# Patient Record
Sex: Male | Born: 1995 | Race: White | Hispanic: No | Marital: Single | Smoking: Never smoker
Health system: Southern US, Community
[De-identification: ages and names within clinical notes are randomized; demographics above are authoritative.]

---
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
## 2017-10-04 ENCOUNTER — Encounter: Payer: Self-pay | Admitting: Pulmonary Disease

## 2017-10-04 ENCOUNTER — Ambulatory Visit (INDEPENDENT_AMBULATORY_CARE_PROVIDER_SITE_OTHER): Payer: Managed Care, Other (non HMO) | Admitting: Pulmonary Disease

## 2017-10-04 VITALS — BP 124/82 | HR 69 | Ht 74.0 in | Wt 228.0 lb

## 2017-10-04 DIAGNOSIS — J45909 Unspecified asthma, uncomplicated: Secondary | ICD-10-CM

## 2017-10-04 MED ORDER — ALBUTEROL SULFATE HFA 108 (90 BASE) MCG/ACT IN AERS: 2.0000 | INHALATION_SPRAY | RESPIRATORY_TRACT | 5 refills | Status: DC | PRN

## 2017-10-04 MED ORDER — FLUTICASONE FUROATE-VILANTEROL 100-25 MCG/INH IN AEPB: 1.0000 | INHALATION_SPRAY | Freq: Every day | RESPIRATORY_TRACT | 6 refills | Status: DC

## 2017-10-04 NOTE — Progress Notes (Signed)
PULMONARY CONSULT NOTE  Requesting MD/Service: Self Date of initial consultation: 10/04/17 Reason for consultation: Asthma  PT PROFILE: 22 y.o. male never smoker who was diagnosed with asthma/allergies as a child.  He has been maintained on Breo inhaler with good effect.  He has no local physician to prescribe this medication.  DATA: Spirometry 10/04/17: FVC 110% predicted, FEV1 96% predicted, FEV1/FVC 72%.  Flow volume curve consistent with possible mild obstruction  INTERVAL:  HPI:  As above.  His asthma has been well controlled on Breo.  He is presently training for a half marathon which he will run next week.  He is concerned because he is almost out of his Breo with no refills available.  He uses albuterol prior to exercise.  Otherwise, he rarely requires albuterol (at most 2 times per week).  He has been prescribed Singulair in the past but never took it since his brother had an adverse reaction to that medication.  He denies CP, fever, purulent sputum, hemoptysis, LE edema and calf tenderness   History reviewed. No pertinent past medical history.  History reviewed. No pertinent surgical history.  MEDICATIONS: I have reviewed all medications and confirmed regimen as documented  Social History   Socioeconomic History  . Marital status: Single    Spouse name: Not on file  . Number of children: Not on file  . Years of education: Not on file  . Highest education level: Not on file  Occupational History  . Not on file  Social Needs  . Financial resource strain: Not on file  . Food insecurity:    Worry: Not on file    Inability: Not on file  . Transportation needs:    Medical: Not on file    Non-medical: Not on file  Tobacco Use  . Smoking status: Never Smoker  . Smokeless tobacco: Never Used  Substance and Sexual Activity  . Alcohol use: No  . Drug use: No  . Sexual activity: Never  Lifestyle  . Physical activity:    Days per week: Not on file    Minutes per  session: Not on file  . Stress: Not on file  Relationships  . Social connections:    Talks on phone: Not on file    Gets together: Not on file    Attends religious service: Not on file    Active member of club or organization: Not on file    Attends meetings of clubs or organizations: Not on file    Relationship status: Not on file  . Intimate partner violence:    Fear of current or ex partner: Not on file    Emotionally abused: Not on file    Physically abused: Not on file    Forced sexual activity: Not on file  Other Topics Concern  . Not on file  Social History Narrative  . Not on file    History reviewed. No pertinent family history.  ROS: No fever, myalgias/arthralgias, unexplained weight loss or weight gain No new focal weakness or sensory deficits No otalgia, hearing loss, visual changes, nasal and sinus symptoms, mouth and throat problems No neck pain or adenopathy No abdominal pain, N/V/D, diarrhea, change in bowel pattern No dysuria, change in urinary pattern   Vitals:   10/04/17 0807  BP: 124/82  Pulse: 69  SpO2: 100%  Weight: 228 lb (103.4 kg)  Height: 6\' 2"  (1.88 m)     EXAM:  Gen: WDWN, No overt respiratory distress HEENT: NCAT, sclera white, oropharynx normal  Neck: Supple without LAN, thyromegaly, JVD Lungs: breath sounds full, percussion normal, no wheezes or other adventitious sounds Cardiovascular: RRR, no murmurs noted Abdomen: Soft, nontender, normal BS Ext: without clubbing, cyanosis, edema Neuro: CNs grossly intact, motor and sensory intact Skin: Limited exam, no lesions noted  DATA:   No flowsheet data found.  No flowsheet data found.  CXR: None available  I have personally reviewed all chest radiographs reported above including CXRs and CT chest unless otherwise indicated  IMPRESSION:     ICD-10-CM   1. Mild persistent asthma J45.909 Spirometry with Graph     PLAN:  Continue Breo 100/25, 1 inhalation daily.  Rinse mouth  after use  Prescription refilled Continue albuterol, 2 inhalations as needed and prior to exercise  Prescription refilled Follow-up in 1 year or sooner as needed   Billy Fischeravid Simonds, MD PCCM service Mobile [phone] Pager [phone] 10/04/2017 3:43 PM

## 2017-10-04 NOTE — Patient Instructions (Signed)
Cont Breo 100/25, one inhalation daily. Rinse mouth after use Continue albuterol 2 inhalations as needed and prior to exercise Follow up in 1 year or sooner as needed

## 2018-05-06 ENCOUNTER — Telehealth: Payer: Self-pay | Admitting: Pulmonary Disease

## 2018-05-06 NOTE — Telephone Encounter (Signed)
Contacted by patient expressing his concern that he has OSA. He is a heavy snorer with reported witnessed apneas and daytime hypersomnolence. We have discussed this in the past and I suggested that he should be evaluated for possible OSA with a PSG. He wishes to have this done now. I have requested a home PSG, We will schedule follow up after results are available to me  Eric Fischer, MD PCCM service Mobile [phone] Pager [phone] 05/06/2018 12:47 PM

## 2018-05-07 ENCOUNTER — Ambulatory Visit (INDEPENDENT_AMBULATORY_CARE_PROVIDER_SITE_OTHER): Payer: Managed Care, Other (non HMO) | Admitting: Pulmonary Disease

## 2018-05-07 ENCOUNTER — Encounter: Payer: Self-pay | Admitting: Pulmonary Disease

## 2018-05-07 VITALS — BP 120/86 | HR 92 | Ht 74.0 in | Wt 234.2 lb

## 2018-05-07 DIAGNOSIS — G4719 Other hypersomnia: Secondary | ICD-10-CM | POA: Diagnosis not present

## 2018-05-07 DIAGNOSIS — R0683 Snoring: Secondary | ICD-10-CM | POA: Diagnosis not present

## 2018-05-07 DIAGNOSIS — J4530 Mild persistent asthma, uncomplicated: Secondary | ICD-10-CM

## 2018-05-07 MED ORDER — FLUTICASONE-SALMETEROL 230-21 MCG/ACT IN AERO: 2.0000 | INHALATION_SPRAY | Freq: Every day | RESPIRATORY_TRACT | 12 refills | Status: DC

## 2018-05-07 NOTE — Progress Notes (Signed)
PULMONARY/SLEEP OFFICE FOLLOW UP NOTE  Requesting MD/Service: Self Date of initial consultation: 10/04/17 Reason for consultation: Asthma  PT PROFILE: 23 y.o. male never smoker who was diagnosed with asthma/allergies as a child.  He has been maintained on Breo inhaler with good effect.  He has no local physician to prescribe this medication.  DATA: Spirometry 10/04/17: FVC 110% predicted, FEV1 96% predicted, FEV1/FVC 72%.  Flow volume curve consistent with possible mild obstruction  INTERVAL: Patient is a friend of my son and contacted me personally to report symptoms of heavy snoring and daytime hypersomnolence  SUBJ:  As above.  He reports heavy snoring and mild to moderate daytime hypersomnolence for several years.  He has had no significant change in his weight over the past year.  With regard to his asthma, it seems to be well controlled and he rarely requires rescue medication.  However, he requests a change from a DPI to an aerosol inhaler.   Vitals:   05/07/18 1017 05/07/18 1018  BP:  120/86  Pulse:  92  SpO2:  98%  Weight: 234 lb 3.2 oz (106.2 kg)   Height: 6\' 2"  (1.88 m)      EXAM:  Gen: NAD HEENT: NCAT, sclera white Neck: No JVD Lungs: breath sounds full, no wheezes or other adventitious sounds Cardiovascular: RRR, no murmurs Abdomen: Soft, nontender, normal BS Ext: without clubbing, cyanosis, edema Neuro: grossly intact Skin: Limited exam, no lesions noted   DATA:   No flowsheet data found.  No flowsheet data found.  CXR: None available  I have personally reviewed all chest radiographs reported above including CXRs and CT chest unless otherwise indicated  IMPRESSION:     ICD-10-CM   1. Mild persistent asthma, well controlled on Breo J45.30   2. Snoring R06.83 Home sleep test  3. Daytime hypersomnolence G47.19 Home sleep test     PLAN:  Change Breo inhaler to Advair 230-21 daily.  Rinse mouth after use Home sleep study ordered We will contact  you after results of the home sleep study are available Follow-up will be arranged as needed   Billy Fischeravid Simonds, MD PCCM service Mobile [phone] Pager [phone] 05/07/2018 1:46 PM

## 2018-05-07 NOTE — Patient Instructions (Addendum)
Change Breo inhaler to Advair 230-21 daily.  Rinse mouth after use Home sleep study ordered We will contact you after results of the home sleep study are available Follow-up will be arranged as needed

## 2018-05-15 ENCOUNTER — Telehealth: Payer: Self-pay | Admitting: Pulmonary Disease

## 2018-05-16 ENCOUNTER — Other Ambulatory Visit: Payer: Self-pay | Admitting: Pulmonary Disease

## 2018-05-16 MED ORDER — FLUTICASONE-SALMETEROL 113-14 MCG/ACT IN AEPB: 2.0000 | INHALATION_SPRAY | Freq: Two times a day (BID) | RESPIRATORY_TRACT | 10 refills | Status: DC

## 2018-05-16 MED ORDER — FLUTICASONE-SALMETEROL 113-14 MCG/ACT IN AEPB: 1.0000 | INHALATION_SPRAY | Freq: Two times a day (BID) | RESPIRATORY_TRACT | 10 refills | Status: DC

## 2018-05-16 NOTE — Telephone Encounter (Signed)
Called and spoke with patient and he wanted to R/S his HST appointment. Pt has R/S to 05/30/2018 for HST pick up between 3:30-4:30. Rhonda J Cobb Nothing else needed at this time. Rhonda J Cobb

## 2018-06-28 DIAGNOSIS — G4733 Obstructive sleep apnea (adult) (pediatric): Secondary | ICD-10-CM

## 2018-07-03 ENCOUNTER — Telehealth: Payer: Self-pay | Admitting: Internal Medicine

## 2018-07-03 DIAGNOSIS — G4719 Other hypersomnia: Secondary | ICD-10-CM

## 2018-07-03 DIAGNOSIS — G4733 Obstructive sleep apnea (adult) (pediatric): Secondary | ICD-10-CM

## 2018-07-03 DIAGNOSIS — R0683 Snoring: Secondary | ICD-10-CM

## 2018-07-03 NOTE — Telephone Encounter (Signed)
Called and spoke to pt, who states that his dentist does do oral appliance.  Pt would like referral placed to [facility] dentist.  Dr. [name] please advise if okay to order oral appliance. Thanks

## 2018-07-03 NOTE — Telephone Encounter (Signed)
Pt returned call. CB [phone]

## 2018-07-03 NOTE — Telephone Encounter (Signed)
I spoke to patient regarding sleep study results. At this time, he would like to proceed with the dental device as opposed to the CPAP. He will contact us with dentist info as to where he wants it to be sent.

## 2018-07-03 NOTE — Telephone Encounter (Addendum)
HST performed on 06/28/18 showed mild OSA with AHI of 6. Dr. Nicholos Johns recommends auto cpap 5-20. Left message to relay results to pt.

## 2018-07-03 NOTE — Telephone Encounter (Signed)
Lm for pt

## 2018-07-03 NOTE — Telephone Encounter (Signed)
Patient states his dentist does not make the dental devices.  Would like to be referred to one of our dentist.  Patient phone number is [phone].

## 2018-07-04 NOTE — Telephone Encounter (Signed)
Pt is returning call. CB is [phone].

## 2018-07-04 NOTE — Telephone Encounter (Signed)
Called and spoke to patient. Let him know of Dr. [name] recommendations, he is wanting to go ahead with dental device. He will also work on weight loss. Referral entered, nothing further needed at this time.

## 2018-07-04 NOTE — Telephone Encounter (Signed)
lmtcb x1 for pt. 

## 2018-07-04 NOTE — Telephone Encounter (Signed)
Please make that referral. Let him know that sleep apnea is very mild. A dental appliance is a reasonable option. Modest weight loss might cure the OSA.  Thanks  Theodoro Grist

## 2019-07-14 ENCOUNTER — Other Ambulatory Visit: Payer: Self-pay | Admitting: Pulmonary Disease

## 2019-07-14 NOTE — Telephone Encounter (Signed)
Lm for pt

## 2019-07-14 NOTE — Telephone Encounter (Signed)
Pt returning call.  [phone].

## 2019-07-14 NOTE — Progress Notes (Signed)
Virtual Visit via Telephone Note  I connected with Eric Gilbert on 07/15/19 at 11:30 AM EDT by telephone and verified that I am speaking with the correct person using two identifiers.  Location: Patient: Home Provider: Office [facility] - [address] [name], [address], [name], [place] [address]   I discussed the limitations, risks, security and privacy concerns of performing an evaluation and management service by telephone and the availability of in person appointments. I also discussed with the patient that there may be a patient responsible charge related to this service. The patient expressed understanding and agreed to proceed.  Patient consented to consult via telephone: Yes People present and their role in pt care: Pt    History of Present Illness:  24 year old male last seen in our office for self-referral on asthma.  Patient was last seen on 05/08/2019 by Dr. Sung Gilbert.  Past medical history:none Smoking history: never smoker Maintenance: Airduo 113 Patient of Dr. Sung Gilbert - needs new Eric Isaac MD   Chief complaint: Med refill, asthma follow-up  Patient reached telephonically.  He reports that he is doing well.  He typically is taking his air duo inhaler 1 puff daily.  He occasionally forgets to take it for up to 2 to 3 days before he realizes that he needs to take it.  Occasionally he has to take the air duo 1 puff twice daily as previously prescribed.  He occasionally takes Zyrtec based off of allergy symptoms.  He does find clinical relief doing this.  He does not take it regularly.  He never needs to use his rescue inhaler.  No known formal allergies.  He typically has worsened allergy symptoms in spring and fall with change of seasons.  No CBC with differential or IgE on file.  Previously diagnosed with mild obstructive sleep apnea.  Did not want to start CPAP.  Referred for oral appliance but this was way too expensive.  Patient declined starting this.  He has  decided to work on weight loss.  He has lost around 10 to 15 pounds.  Observations/Objective:  Spirometry 10/04/17: FVC 110% predicted, FEV1 96% predicted, FEV1/FVC 72%.  Flow volume curve consistent with possible mild obstruction  HST performed on 06/28/18 showed mild OSA with AHI of 6. >>>Dr. Nicholos Gilbert recommends auto cpap 5-20. >>> Patient elected to try oral appliance  Social History   Tobacco Use  Smoking Status Never Smoker  Smokeless Tobacco Never Used    There is no immunization history on file for this patient.   Assessment and Plan:  Asthma Plan:  Continue Air Duo 1 puff daily, can increase twice daily based on symptoms  Continue Zyrtec  Can use rescue inhaler as needed  Consider lab work at next ov - CBC with diff / IgE 1 year follow-up to establish care with Dr. Belia Gilbert slot  OSA (obstructive sleep apnea) Plan:  Continue to work on lowering your BMI   Follow Up Instructions:  Return in about 1 year (around 07/14/2020), or if symptoms worsen or fail to improve, for [facility] - Dr. Belia Gilbert, NEW PULMONOLOGIST [place].   I discussed the assessment and treatment plan with the patient. The patient was provided an opportunity to ask questions and all were answered. The patient agreed with the plan and demonstrated an understanding of the instructions.   The patient was advised to call back or seek an in-person evaluation if the symptoms worsen or if the condition fails to improve as anticipated.  I provided 22 minutes of non-face-to-face time during this encounter.   Lauraine Rinne, NP

## 2019-07-14 NOTE — Telephone Encounter (Signed)
Patient returned call. Please call back

## 2019-07-15 ENCOUNTER — Encounter: Payer: Self-pay | Admitting: Pulmonary Disease

## 2019-07-15 ENCOUNTER — Ambulatory Visit (INDEPENDENT_AMBULATORY_CARE_PROVIDER_SITE_OTHER): Payer: Managed Care, Other (non HMO) | Admitting: Pulmonary Disease

## 2019-07-15 ENCOUNTER — Other Ambulatory Visit: Payer: Self-pay

## 2019-07-15 DIAGNOSIS — J4520 Mild intermittent asthma, uncomplicated: Secondary | ICD-10-CM

## 2019-07-15 DIAGNOSIS — G4733 Obstructive sleep apnea (adult) (pediatric): Secondary | ICD-10-CM | POA: Diagnosis not present

## 2019-07-15 DIAGNOSIS — J45909 Unspecified asthma, uncomplicated: Secondary | ICD-10-CM | POA: Insufficient documentation

## 2019-07-15 MED ORDER — FLUTICASONE-SALMETEROL 113-14 MCG/ACT IN AEPB: 1.0000 | INHALATION_SPRAY | Freq: Two times a day (BID) | RESPIRATORY_TRACT | 10 refills | Status: DC

## 2019-07-15 NOTE — Assessment & Plan Note (Addendum)
Plan:  Continue Air Duo 1 puff daily, can increase twice daily based on symptoms  Continue Zyrtec  Can use rescue inhaler as needed  Consider lab work at next ov - CBC with diff / IgE 1 year follow-up to establish care with Dr. [name] slot

## 2019-07-15 NOTE — Telephone Encounter (Signed)
Patient had appointment with Eric Gilbert today medication was refilled Nothing further needed at this time

## 2019-07-15 NOTE — Assessment & Plan Note (Signed)
Plan:  Continue to work on lowering your BMI

## 2019-07-15 NOTE — Patient Instructions (Addendum)
You were seen today by Coral Ceo, NP  for:   Nice talking to you today over the phone.  Glad you are doing well.  We will refill your inhaler.  We will bring you back into our office in 1 year to establish care with Dr. [name].  Keep up the hard work with running and improving your exercise regimen.  Congratulations on your weight loss!  Arlys John   1. Mild intermittent asthma without complication  - Fluticasone-Salmeterol (AIRDUO RESPICLICK 113/14) 113-14 MCG/ACT AEPB; Inhale 1 puff into the lungs 2 (two) times daily.  Dispense: 1 each; Refill: 10  2. OSA (obstructive sleep apnea)  Keep up the hard work with your weight loss!   We recommend today:   Meds ordered this encounter  Medications  . Fluticasone-Salmeterol (AIRDUO RESPICLICK 113/14) 113-14 MCG/ACT AEPB    Sig: Inhale 1 puff into the lungs 2 (two) times daily.    Dispense:  1 each    Refill:  10    Follow Up:    Return in about 1 year (around 07/14/2020), or if symptoms worsen or fail to improve, for [facility] - Dr. [name], NEW PULMONOLOGIST [place].   Please do your part to reduce the spread of COVID-19:      Reduce your risk of any infection  and COVID19 by using the similar precautions used for avoiding the common cold or flu:  Marland Kitchen Wash your hands often with soap and warm water for at least 20 seconds.  If soap and water are not readily available, use an alcohol-based hand sanitizer with at least 60% alcohol.  . If coughing or sneezing, cover your mouth and nose by coughing or sneezing into the elbow areas of your shirt or coat, into a tissue or into your sleeve (not your hands). Drinda Butts A MASK when in public  . Avoid shaking hands with others and consider head nods or verbal greetings only. . Avoid touching your eyes, nose, or mouth with unwashed hands.  . Avoid close contact with people who are sick. . Avoid places or events with large numbers of people in one location, like concerts or sporting  events. . If you have some symptoms but not all symptoms, continue to monitor at home and seek medical attention if your symptoms worsen. . If you are having a medical emergency, call 911.   ADDITIONAL HEALTHCARE OPTIONS FOR PATIENTS  [facility] / e-Visit: [email or web address]         [facility]: [phone]  [facility]: [phone]                   [facility]: [phone]     It is flu season:   >>> Best ways to protect herself from the flu: Receive the yearly flu vaccine, practice good hand hygiene washing with soap and also using hand sanitizer when available, eat a nutritious meals, get adequate rest, hydrate appropriately   Please contact the office if your symptoms worsen or you have concerns that you are not improving.   Thank you for choosing [facility] for your healthcare, and for allowing [place] to partner with you on your healthcare journey. I am thankful to be able to provide care to you today.   Elisha Headland FNP-C

## 2020-12-14 ENCOUNTER — Telehealth: Payer: Self-pay | Admitting: Internal Medicine

## 2020-12-14 DIAGNOSIS — J4520 Mild intermittent asthma, uncomplicated: Secondary | ICD-10-CM

## 2020-12-15 ENCOUNTER — Other Ambulatory Visit: Payer: Self-pay | Admitting: Acute Care

## 2020-12-15 DIAGNOSIS — J4520 Mild intermittent asthma, uncomplicated: Secondary | ICD-10-CM

## 2020-12-15 MED ORDER — FLUTICASONE-SALMETEROL 113-14 MCG/ACT IN AEPB: 1.0000 | INHALATION_SPRAY | Freq: Two times a day (BID) | RESPIRATORY_TRACT | 0 refills | Status: DC

## 2020-12-15 NOTE — Telephone Encounter (Signed)
Spoke with patient to let him know that Maralyn Sago was ok with me sending in 1 refill and that we needed to schedule him for a Televisit for additional refills. Patient is not established with a provider at this time, used to see Dr. [name] in [place]. Has relocated to [facility]. Patient is now scheduled for Televisit for additional refills. Nothing further needed a this time.

## 2020-12-15 NOTE — Telephone Encounter (Signed)
Called and spoke with patient who was calling to do a Televisit because he needs a refill on his Airduo Respiclick inhaler. Patient has relocated to [facility] and is trying to check with his insurance to see if he needs referral to establish care with someone there that is closer.   Maralyn Sago are you ok with refilling inhaler for patient?

## 2020-12-15 NOTE — Telephone Encounter (Signed)
ATC patient, LMTCB 

## 2020-12-29 ENCOUNTER — Ambulatory Visit: Payer: Managed Care, Other (non HMO) | Admitting: Primary Care

## 2021-01-07 ENCOUNTER — Other Ambulatory Visit: Payer: Self-pay

## 2021-01-07 ENCOUNTER — Encounter: Payer: Self-pay | Admitting: Primary Care

## 2021-01-07 ENCOUNTER — Ambulatory Visit (INDEPENDENT_AMBULATORY_CARE_PROVIDER_SITE_OTHER): Payer: No Typology Code available for payment source | Admitting: Primary Care

## 2021-01-07 DIAGNOSIS — J4520 Mild intermittent asthma, uncomplicated: Secondary | ICD-10-CM | POA: Diagnosis not present

## 2021-01-07 MED ORDER — ALBUTEROL SULFATE HFA 108 (90 BASE) MCG/ACT IN AERS: 2.0000 | INHALATION_SPRAY | RESPIRATORY_TRACT | 5 refills | Status: AC | PRN

## 2021-01-07 MED ORDER — FLUTICASONE-SALMETEROL 113-14 MCG/ACT IN AEPB: 1.0000 | INHALATION_SPRAY | Freq: Two times a day (BID) | RESPIRATORY_TRACT | 11 refills | Status: AC

## 2021-01-07 NOTE — Patient Instructions (Signed)
  Mild intermittent asthma: - Well controlled on medium dose ICS/LABA inhaler - Continue Airduo respiclick one puff 2 times daily, Zyrtec 10mg  daily and prn albuterol hfa 2 puffs q 6 hours   RX: - AirDuo Respiclick 113-4mcg/act; Albuterol hfa 108 (90 base) mcg/act  Follow-up: -1 year with Dr. 13m in [place] or sooner if needed

## 2021-01-07 NOTE — Progress Notes (Signed)
Virtual Visit via Telephone Note  I connected with Eric Gilbert on 01/07/21 at  3:00 PM EDT by telephone and verified that I am speaking with the correct person using two identifiers.  Location: Patient: Home Provider: Office   I discussed the limitations, risks, security and privacy concerns of performing an evaluation and management service by telephone and the availability of in person appointments. I also discussed with the patient that there may be a patient responsible charge related to this service. The patient expressed understanding and agreed to proceed.   History of Present Illness: 25- year-old male. PMH significant for asthma. Former patient of Dr. Sung Gilbert, last seen by pulmonary NP on 07/15/19.    Past medical history:none Smoking history: never smoker Maintenance: Airduo 113 Patient of Dr. Sung Gilbert - needs new Eric Isaac MD   Previous [facility] pulmonary encounter:  07/14/20 Eric Hau NP Asthma follow-up Patient reached telephonically.  He reports that he is doing well.  He typically is taking his air duo inhaler 1 puff daily.  He occasionally forgets to take it for up to 2 to 3 days before he realizes that he needs to take it.  Occasionally he has to take the air duo 1 puff twice daily as previously prescribed.  He occasionally takes Zyrtec based off of allergy symptoms.  He does find clinical relief doing this.  He does not take it regularly.  He never needs to use his rescue inhaler.  No known formal allergies.  He typically has worsened allergy symptoms in spring and fall with change of seasons.  No CBC with differential or IgE on file.  Previously diagnosed with mild obstructive sleep apnea.  Did not want to start CPAP.  Referred for oral appliance but this was way too expensive.  Patient declined starting this.  He has decided to work on weight loss.  He has lost around 10 to 15 pounds.  01/07/2021- Interim hx  Patient contacted today for overdue follow-up Asthma. Needing  medication refill. He is doing well. No acute respiratory complaints. He feels his asthma is well controlled on current regimen. He uses his Airduo every other day. He does not require his Albuterol rescue inhaler. He is not interested in getting influenza vaccine.    Observations/Objective:  - Able to speak in full sentences; no overt shortness of breath or wheezing   Spirometry 10/04/17: FVC 110% predicted, FEV1 96% predicted, FEV1/FVC 72%.  Flow volume curve consistent with possible mild obstruction  HST performed on 06/28/18 showed mild OSA with AHI of 6. >>>Dr. Nicholos Gilbert recommends auto cpap 5-20. >>> Patient elected to try oral appliance  Assessment and Plan:  Mild intermittent asthma: - Well controlled on medium dose ICS/LABA inhaler. ACT score 25 - No recent exacerbations. No SABA use.  - Continue Airduo respiclick one puff 2 times daily, Zyrtec 10mg  daily and prn albuterol hfa 2 puffs q 6 hours  - Rx AirDuo Respiclick 113-15mcg/act; Albuterol hfa 108 (90 base) mcg/act - Follow-up in 1 year or sooner if needed   Follow Up Instructions:   - 1 year with Dr. 13m in [place]   I discussed the assessment and treatment plan with the patient. The patient was provided an opportunity to ask questions and all were answered. The patient agreed with the plan and demonstrated an understanding of the instructions.   The patient was advised to call back or seek an in-person evaluation if the symptoms worsen or if the condition fails to improve as anticipated.  I  provided 22 minutes of non-face-to-face time during this encounter.   Eric Bayley, NP

## 2021-05-09 ENCOUNTER — Telehealth: Payer: Self-pay

## 2021-05-09 ENCOUNTER — Other Ambulatory Visit (HOSPITAL_COMMUNITY): Payer: Self-pay

## 2021-05-09 ENCOUNTER — Telehealth: Payer: Self-pay | Admitting: Internal Medicine

## 2021-05-09 NOTE — Telephone Encounter (Signed)
Patient Advocate Encounter  Prior Authorization for Airduo RespiClick [id number] has been approved.    PA# [id number]  Effective dates: 05/09/21 through 05/09/22  Per Test Claim Patients co-pay is $25   Spoke with Pharmacy to Process.  Patient Advocate Fax: [phone]

## 2021-05-09 NOTE — Telephone Encounter (Signed)
Lm for patient.  

## 2021-05-09 NOTE — Telephone Encounter (Signed)
Patient returning call.

## 2021-05-09 NOTE — Telephone Encounter (Signed)
PA team, can you guys help with this?

## 2021-05-10 NOTE — Telephone Encounter (Signed)
Lm for patient.  

## 2021-05-11 NOTE — Telephone Encounter (Signed)
Lm x2 for patient.  Will close encounter per office protocol.   

## 2022-04-12 ENCOUNTER — Telehealth: Payer: Self-pay

## 2022-04-12 NOTE — Telephone Encounter (Signed)
PA renewal request received via fax [employer] for AirDuo RespiClick 113/14 113-14MCG/ACT aerosol powder.  PA renewal has been submitted through [facility] and is awaiting determination.   Key: Marlana Salvage

## 2022-04-27 ENCOUNTER — Other Ambulatory Visit: Payer: Self-pay | Admitting: Primary Care

## 2022-04-27 DIAGNOSIS — J4520 Mild intermittent asthma, uncomplicated: Secondary | ICD-10-CM

## 2022-04-27 NOTE — Telephone Encounter (Signed)
PA has been APPROVED.

## 2022-05-12 ENCOUNTER — Other Ambulatory Visit (HOSPITAL_COMMUNITY): Payer: Self-pay

## 2022-06-05 ENCOUNTER — Other Ambulatory Visit: Payer: Self-pay | Admitting: Primary Care

## 2022-06-05 DIAGNOSIS — J4520 Mild intermittent asthma, uncomplicated: Secondary | ICD-10-CM

## 2022-06-05 NOTE — Telephone Encounter (Signed)
Pt needs an appointment. Not been seen since 2021

## 2023-04-30 ENCOUNTER — Other Ambulatory Visit (HOSPITAL_COMMUNITY): Payer: Self-pay
